# Patient Record
Sex: Male | Born: 2000 | Race: White | Hispanic: No | Marital: Single | Smoking: Never smoker
Health system: Southern US, Community
[De-identification: ages and names within clinical notes are randomized; demographics above are authoritative.]

---
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
## 2021-01-05 ENCOUNTER — Ambulatory Visit
Admission: RE | Admit: 2021-01-05 | Discharge: 2021-01-05 | Disposition: A | Discharge status: Home / Self Care | Payer: Commercial Managed Care - PPO | Source: Ambulatory Visit | Attending: Emergency Medicine | Admitting: Emergency Medicine

## 2021-01-05 ENCOUNTER — Other Ambulatory Visit: Payer: Self-pay

## 2021-01-05 VITALS — BP 119/69 | HR 76 | Temp 97.8°F | Resp 18

## 2021-01-05 DIAGNOSIS — B070 Plantar wart: Secondary | ICD-10-CM | POA: Diagnosis not present

## 2021-01-05 DIAGNOSIS — J029 Acute pharyngitis, unspecified: Secondary | ICD-10-CM | POA: Diagnosis not present

## 2021-01-05 DIAGNOSIS — Z1152 Encounter for screening for COVID-19: Secondary | ICD-10-CM | POA: Insufficient documentation

## 2021-01-05 DIAGNOSIS — T7840XA Allergy, unspecified, initial encounter: Secondary | ICD-10-CM | POA: Insufficient documentation

## 2021-01-05 LAB — POCT RAPID STREP A (OFFICE): Rapid Strep A Screen: NEGATIVE

## 2021-01-05 MED ORDER — FLUTICASONE PROPIONATE 50 MCG/ACT NA SUSP: 2.0000 | Freq: Every day | NASAL | 0 refills | Status: DC

## 2021-01-05 MED ORDER — IBUPROFEN 600 MG PO TABS: 600.0000 mg | ORAL_TABLET | Freq: Four times a day (QID) | ORAL | 0 refills | Status: DC | PRN

## 2021-01-05 NOTE — ED Provider Notes (Signed)
HPI  SUBJECTIVE:  Patient reports sore throat starting yesterday. Sx worse with decongestions, talking.  Sx better with ibuprofen 600 mg.  He reports bilateral ear "tightness".  No change in hearing, otorrhea No fever   No neck stiffness  No Cough + nasal congestion, rhinorrhea, postnasal drip No Myalgias No Headache No Rash  No loss of taste or smell No shortness of breath or difficulty breathing No nausea, vomiting No diarrhea No abdominal pain     No Recent Strep,  COVID exposure No reflux sxs + States that his allergies are bothering him   No Breathing difficulty, voice changes, sensation of throat swelling shut No Drooling No Trismus No abx in past month. All immunizations UTD.  + antipyretic in past 4-6 hrs  Patient also reports a slightly painful "bump" on his left foot starting several months ago. He has a past medical history of allergies and is not on any antihistamines.  No history of GERD, frequent strep, mono, diabetes. PMD: [facility] pediatric[facility]   History reviewed. No pertinent past medical history.  History reviewed. No pertinent surgical history.  Family History  Family history unknown: Yes    Social History   Tobacco Use  . Smoking status: Never Smoker  Substance Use Topics  . Alcohol use: No    No current facility-administered medications for this encounter.  Current Outpatient Medications:  .  fluticasone (FLONASE) 50 MCG/ACT nasal spray, Place 2 sprays into both nostrils daily., Disp: 16 g, Rfl: 0 .  ibuprofen (ADVIL) 600 MG tablet, Take 1 tablet (600 mg total) by mouth every 6 (six) hours as needed., Disp: 30 tablet, Rfl: 0  No Known Allergies   ROS  As noted in HPI.   Physical Exam  BP 119/69   Pulse 76   Temp 97.8 F (36.6 C)   Resp 18   SpO2 98%   Constitutional: Well developed, well nourished, no acute distress Eyes:  EOMI, conjunctiva normal bilaterally HENT: Normocephalic, atraumatic,mucus membranes moist.  TMs  normal bilaterally.  +nasal congestion + intensely erythematous oropharynx  - enlarged tonsils - exudates.  Positive postnasal drip, cobblestoning.  Uvula midline.  Respiratory: Normal inspiratory effort Cardiovascular: Normal rate, no murmurs, rubs, gallops GI: nondistended, nontender. No appreciable splenomegaly skin: Small plantar wart on sole of left foot.  No evidence of infection Lymph: -  Anterior cervical LN.  No posterior cervical lymphadenopathy Musculoskeletal: no deformities Neurologic: Alert & oriented x 3, no focal neuro deficits Psychiatric: Speech and behavior appropriate.   ED Course   Medications - No data to display  Orders Placed This Encounter  Procedures  . Novel Coronavirus, NAA ([facility])    Standing Status:   Standing    Number of Occurrences:   1  . Culture, group A strep    Standing Status:   Standing    Number of Occurrences:   1  . POCT rapid strep A    Standing Status:   Standing    Number of Occurrences:   1    Results for orders placed or performed during the hospital encounter of 01/05/21 (from the past 24 hour(s))  POCT rapid strep A     Status: None   Collection Time: 01/05/21  3:32 PM  Result Value Ref Range   Rapid Strep A Screen Negative Negative   No results found.  ED Clinical Impression  1. Acute pharyngitis, unspecified etiology   2. Encounter for screening for COVID-19   3. Allergy, initial encounter   4. Plantar  wart of left foot     ED Assessment/Plan  1.  Sore throat.  COVID sent.  Suspect allergies with postnasal drip causing sore throat.  Will send home with Flonase, and antihistamine/decongestant combination, Tylenol/ibuprofen, Benadryl/Maalox mixture, saline nasal irrigation.  Rapid strep negative. Obtaining throat culture to guide antibiotic treatment. Discussed this with patient. Will contact him if culture is positive, and will call in Appropriate antibiotics. Patient home with ibuprofen, Tylenol, Benadryl/Maalox  mixture.. Patient to followup with PMD when necessary.  2.  Plantars wart left foot.  Advised salicylic acid and duct tape.  Follow-up with podiatry if he gets worse to have it frozen off.  Discussed labs,  MDM, plan and followup with patient. Discussed sn/sx that should prompt return to the ED. patient agrees with plan.   Meds ordered this encounter  Medications  . fluticasone (FLONASE) 50 MCG/ACT nasal spray    Sig: Place 2 sprays into both nostrils daily.    Dispense:  16 g    Refill:  0  . ibuprofen (ADVIL) 600 MG tablet    Sig: Take 1 tablet (600 mg total) by mouth every 6 (six) hours as needed.    Dispense:  30 tablet    Refill:  0     *This clinic note was created using Scientist, clinical (histocompatibility and immunogenetics). Therefore, there may be occasional mistakes despite careful proofreading.    Domenick Gong, MD 01/06/21 1209

## 2021-01-05 NOTE — ED Triage Notes (Signed)
Pt presents with c/o sore throat and right ear pain that began yesterday

## 2021-01-05 NOTE — Discharge Instructions (Addendum)
your rapid strep was negative today, so we have sent off a throat culture.  We will contact you and call in the appropriate antibiotics if your culture comes back positive for an infection requiring antibiotic treatment.  Give [place] a working phone number.  In the meantime, take an antihistamine/decongestant combination such as Claritin-D, Allegra-D, Zyrtec-D, Flonase, saline nasal irrigation with a NeilMed sinus rinse and distilled water as often as you want for your allergies/nasal congestion/postnasal drip.  1 gram of Tylenol and 600 mg ibuprofen together 3-4 times a day as needed for pain.  Make sure you drink plenty of extra fluids.  Some people find salt water gargles and  Traditional Medicinal's "Throat Coat" tea helpful. Take 5 mL of liquid Benadryl and 5 mL of Maalox. Mix it together, and then hold it in your mouth for as long as you can and then swallow. You may do this 4 times a day.    Get a salicylic acid patch to put on the plantars wart.  May also use duct tape.  Follow-up with podiatry if it becomes bothersome.  Go to www.goodrx.com  or www.costplusdrugs.com to look up your medications. This will give you a list of where you can find your prescriptions at the most affordable prices. Or ask the pharmacist what the cash price is, or if they have any other discount programs available to help make your medication more affordable. This can be less expensive than what you would pay with insurance.

## 2021-01-06 LAB — SARS-COV-2, NAA 2 DAY TAT

## 2021-01-06 LAB — NOVEL CORONAVIRUS, NAA: SARS-CoV-2, NAA: NOT DETECTED

## 2021-01-08 LAB — CULTURE, GROUP A STREP (THRC)

## 2021-06-11 ENCOUNTER — Other Ambulatory Visit: Payer: Self-pay

## 2021-06-11 ENCOUNTER — Ambulatory Visit (INDEPENDENT_AMBULATORY_CARE_PROVIDER_SITE_OTHER): Payer: Commercial Managed Care - PPO | Admitting: Emergency Medicine

## 2021-06-11 ENCOUNTER — Encounter: Payer: Self-pay | Admitting: Emergency Medicine

## 2021-06-11 VITALS — BP 115/60 | HR 81 | Ht 69.0 in | Wt 174.0 lb

## 2021-06-11 DIAGNOSIS — Z7689 Persons encountering health services in other specified circumstances: Secondary | ICD-10-CM | POA: Diagnosis not present

## 2021-06-11 DIAGNOSIS — Z13228 Encounter for screening for other metabolic disorders: Secondary | ICD-10-CM | POA: Diagnosis not present

## 2021-06-11 DIAGNOSIS — Z0000 Encounter for general adult medical examination without abnormal findings: Secondary | ICD-10-CM

## 2021-06-11 DIAGNOSIS — Z13220 Encounter for screening for lipoid disorders: Secondary | ICD-10-CM | POA: Diagnosis not present

## 2021-06-11 DIAGNOSIS — Z130 Encounter for screening for diseases of the blood and blood-forming organs and certain disorders involving the immune mechanism: Secondary | ICD-10-CM | POA: Diagnosis not present

## 2021-06-11 DIAGNOSIS — Z1159 Encounter for screening for other viral diseases: Secondary | ICD-10-CM

## 2021-06-11 DIAGNOSIS — Z1329 Encounter for screening for other suspected endocrine disorder: Secondary | ICD-10-CM

## 2021-06-11 LAB — COMPREHENSIVE METABOLIC PANEL
ALT: 20 U/L (ref 0–53)
AST: 21 U/L (ref 0–37)
Albumin: 4.5 g/dL (ref 3.5–5.2)
Alkaline Phosphatase: 82 U/L (ref 52–171)
BUN: 16 mg/dL (ref 6–23)
CO2: 27 mEq/L (ref 19–32)
Calcium: 9.3 mg/dL (ref 8.4–10.5)
Chloride: 104 mEq/L (ref 96–112)
Creatinine, Ser: 0.98 mg/dL (ref 0.40–1.50)
GFR: 112.12 mL/min (ref >60.00)
Glucose, Bld: 92 mg/dL (ref 70–99)
Potassium: 4.6 mEq/L (ref 3.5–5.1)
Sodium: 137 mEq/L (ref 135–145)
Total Bilirubin: 0.3 mg/dL (ref 0.2–1.2)
Total Protein: 7.1 g/dL (ref 6.0–8.3)

## 2021-06-11 LAB — CBC WITH DIFFERENTIAL/PLATELET
Basophils Absolute: 0 10*3/uL (ref 0.0–0.1)
Basophils Relative: 0.4 % (ref 0.0–3.0)
Eosinophils Absolute: 0.1 10*3/uL (ref 0.0–0.7)
Eosinophils Relative: 1 % (ref 0.0–5.0)
HCT: 41.9 % (ref 36.0–49.0)
Hemoglobin: 14.1 g/dL (ref 12.0–16.0)
Lymphocytes Relative: 16.4 % — ABNORMAL LOW (ref 24.0–48.0)
Lymphs Abs: 1 10*3/uL (ref 0.7–4.0)
MCHC: 33.8 g/dL (ref 31.0–37.0)
MCV: 90.4 fl (ref 78.0–98.0)
Monocytes Absolute: 0.5 10*3/uL (ref 0.1–1.0)
Monocytes Relative: 8.6 % (ref 3.0–12.0)
Neutro Abs: 4.4 10*3/uL (ref 1.4–7.7)
Neutrophils Relative %: 73.6 % — ABNORMAL HIGH (ref 43.0–71.0)
Platelets: 249 10*3/uL (ref 150.0–575.0)
RBC: 4.63 Mil/uL (ref 3.80–5.70)
RDW: 13 % (ref 11.4–15.5)
WBC: 6 10*3/uL (ref 4.5–13.5)

## 2021-06-11 LAB — LIPID PANEL
Cholesterol: 145 mg/dL (ref 0–200)
HDL: 54.3 mg/dL (ref >39.00)
LDL Cholesterol: 82 mg/dL (ref 0–99)
NonHDL: 91.07
Total CHOL/HDL Ratio: 3
Triglycerides: 45 mg/dL (ref 0.0–149.0)
VLDL: 9 mg/dL (ref 0.0–40.0)

## 2021-06-11 LAB — HEMOGLOBIN A1C: Hgb A1c MFr Bld: 5.7 % (ref 4.6–6.5)

## 2021-06-11 NOTE — Progress Notes (Signed)
Andrew Herring 20 y.o.   Chief Complaint  Patient presents with  . New Patient (Initial Visit)    Needing PCP    HISTORY OF PRESENT ILLNESS: This is a 20 y.o. 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 Wants to establish care with me. Healthy male with a healthy lifestyle.  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  No chronic medications. Has no complaints or medical concerns today.  HPI   Prior to Admission medications   Not on File    No Known Allergies  There are no problems to display for this patient.   No past medical history on file.  No past surgical history on file.  Social History   Socioeconomic History  . Marital status: Single    Spouse name: Not on file  . Number of children: Not on file  . Years of education: Not on file  . Highest education level: Not on file  Occupational History  . Not on file  Tobacco Use  . Smoking status: Never  . Smokeless tobacco: Not on file  Substance and Sexual Activity  . Alcohol use: No  . Drug use: Not on file  . Sexual activity: Not on file  Other Topics Concern  . Not on file  Social History Narrative  . Not on file   Social Determinants of Health   Financial Resource Strain: Not on file  Food Insecurity: Not on file  Transportation Needs: Not on file  Physical Activity: Not on file  Stress: Not on file  Social Connections: Not on file  Intimate Partner Violence: Not on file    Family History  Family history unknown: Yes     Review of Systems  Constitutional: Negative.  Negative for chills and fever.  HENT: Negative.  Negative for congestion and sore throat.   Eyes: Negative.   Respiratory: Negative.  Negative for cough and shortness of breath.   Cardiovascular: Negative.  Negative for chest pain and palpitations.  Gastrointestinal: Negative.  Negative for abdominal pain, blood in stool, diarrhea, melena, nausea and vomiting.   Genitourinary: Negative.  Negative for dysuria and hematuria.  Musculoskeletal: Negative.   Skin: Negative.  Negative for rash.  Neurological: Negative.  Negative for dizziness and headaches.  All other systems reviewed and are negative.   Physical Exam Vitals reviewed.  Constitutional:      Appearance: Normal appearance.  HENT:     Head: Normocephalic.     Right Ear: Tympanic membrane, ear canal and external ear normal.     Left Ear: Tympanic membrane, ear canal and external ear normal.     Mouth/Throat:     Mouth: Mucous membranes are moist.     Pharynx: Oropharynx is clear.  Eyes:     Extraocular Movements: Extraocular movements intact.     Conjunctiva/sclera: Conjunctivae normal.     Pupils: Pupils are equal, round, and reactive to light.  Neck:     Vascular: No carotid bruit.  Cardiovascular:     Rate and Rhythm: Normal rate and regular rhythm.     Pulses: Normal pulses.     Heart sounds: Normal heart sounds.  Pulmonary:     Effort: Pulmonary effort is normal.     Breath sounds: Normal breath sounds.  Abdominal:     General: Bowel sounds are normal. There is no distension.     Palpations: Abdomen is soft. There is no mass.     Tenderness: There is no abdominal tenderness.  Musculoskeletal:        General: Normal range of motion.     Cervical back: Normal range of motion and neck supple. No tenderness.     Right lower leg: No edema.     Left lower leg: No edema.  Lymphadenopathy:     Cervical: No cervical adenopathy.  Skin:    General: Skin is warm and dry.     Capillary Refill: Capillary refill takes less than 2 seconds.  Neurological:     General: No focal deficit present.     Mental Status: He is alert and oriented to person, place, and time.  Psychiatric:        Mood and Affect: Mood normal.        Behavior: Behavior normal.     ASSESSMENT & PLAN: Andrew Herring was seen today for new patient (initial visit).  Diagnoses and all orders for this  visit:  Routine general medical examination at a health care facility  Encounter to establish care  Need for hepatitis C screening test -     Hepatitis C antibody screen  Screening for deficiency anemia -     CBC with Differential  Screening for lipoid disorders -     Lipid panel  Screening for endocrine, metabolic and immunity disorder -     Comprehensive metabolic panel -     Hemoglobin A1c  Modifiable risk factors discussed with patient. Anticipatory guidance according to age provided. The following topics were also discussed: Social Determinants of Health Smoking.  Non-smoker Diet and nutrition Benefits of exercise Cancer family history reviewed Vaccinations recommendations and review Cardiovascular risk assessment Mental health including depression and anxiety Fall and accident prevention  Patient Instructions  Health Maintenance, Male Adopting a healthy lifestyle and getting preventive care are important in promoting health and wellness. Ask your health care provider about: The right schedule for you to have regular tests and exams. Things you can do on your own to prevent diseases and keep yourself healthy. What should I know about diet, weight, and exercise? Eat a healthy diet  Eat a diet that includes plenty of vegetables, fruits, low-fat dairy products, and lean protein. Do not eat a lot of foods that are high in solid fats, added sugars, or sodium. Maintain a healthy weight Body mass index (BMI) is a measurement that can be used to identify possible weight problems. It estimates body fat based on height and weight. Your health care provider can help determine your BMI and help you achieve or maintain a healthy weight. Get regular exercise Get regular exercise. This is one of the most important things you can do for your health. Most adults should: Exercise for at least 150 minutes each week. The exercise should increase your heart rate and make you sweat  (moderate-intensity exercise). Do strengthening exercises at least twice a week. This is in addition to the moderate-intensity exercise. Spend less time sitting. Even light physical activity can be beneficial. Watch cholesterol and blood lipids Have your blood tested for lipids and cholesterol at 19 years of age, then have this test every 5 years. You may need to have your cholesterol levels checked more often if: Your lipid or cholesterol levels are high. You are older than 19 years of age. You are at high risk for heart disease. What should I know about cancer screening? Many types of cancers can be detected early and may often be prevented. Depending on your health history and family history, you may need to have cancer  screening at various ages. This may include screening for: Colorectal cancer. Prostate cancer. Skin cancer. Lung cancer. What should I know about heart disease, diabetes, and high blood pressure? Blood pressure and heart disease High blood pressure causes heart disease and increases the risk of stroke. This is more likely to develop in people who have high blood pressure readings, are of African descent, or are overweight. Talk with your health care provider about your target blood pressure readings. Have your blood pressure checked: Every 3-5 years if you are 71-35 years of age. Every year if you are 25 years old or older. If you are between the ages of 46 and 28 and are a current or former smoker, ask your health care provider if you should have a one-time screening for abdominal aortic aneurysm (AAA). Diabetes Have regular diabetes screenings. This checks your fasting blood sugar level. Have the screening done: Once every three years after age 18 if you are at a normal weight and have a low risk for diabetes. More often and at a younger age if you are overweight or have a high risk for diabetes. What should I know about preventing infection? Hepatitis B If you have  a higher risk for hepatitis B, you should be screened for this virus. Talk with your health care provider to find out if you are at risk for hepatitis B infection. Hepatitis C Blood testing is recommended for: Everyone born from [birth year] through 1964. Anyone with known risk factors for hepatitis C. Sexually transmitted infections (STIs) You should be screened each year for STIs, including gonorrhea and chlamydia, if: You are sexually active and are younger than 19 years of age. You are older than 19 years of age and your health care provider tells you that you are at risk for this type of infection. Your sexual activity has changed since you were last screened, and you are at increased risk for chlamydia or gonorrhea. Ask your health care provider if you are at risk. Ask your health care provider about whether you are at high risk for HIV. Your health care provider may recommend a prescription medicine to help prevent HIV infection. If you choose to take medicine to prevent HIV, you should first get tested for HIV. You should then be tested every 3 months for as long as you are taking the medicine. Follow these instructions at home: Lifestyle Do not use any products that contain nicotine or tobacco, such as cigarettes, e-cigarettes, and chewing tobacco. If you need help quitting, ask your health care provider. Do not use street drugs. Do not share needles. Ask your health care provider for help if you need support or information about quitting drugs. Alcohol use Do not drink alcohol if your health care provider tells you not to drink. If you drink alcohol: Limit how much you have to 0-2 drinks a day. Be aware of how much alcohol is in your drink. In the [place] one drink equals one 12 oz bottle of beer (355 mL), one 5 oz glass of wine (148 mL), or one 1 oz glass of hard liquor (44 mL). General instructions Schedule regular health, dental, and eye exams. Stay current with your vaccines. Tell your  health care provider if: You often feel depressed. You have ever been abused or do not feel safe at home. Summary Adopting a healthy lifestyle and getting preventive care are important in promoting health and wellness. Follow your health care provider's instructions about healthy diet, exercising, and getting tested or  screened for diseases. Follow your health care provider's instructions on monitoring your cholesterol and blood pressure. This information is not intended to replace advice given to you by your health care provider. Make sure you discuss any questions you have with your health care provider. Document Revised: 10/27/2020 Document Reviewed: 08/12/2018 Elsevier Patient Education  2022 [employer]     Edwina Barth, MD [facility]

## 2021-06-11 NOTE — Patient Instructions (Signed)
Health Maintenance, Male Adopting a healthy lifestyle and getting preventive care are important in promoting health and wellness. Ask your health care provider about: The right schedule for you to have regular tests and exams. Things you can do on your own to prevent diseases and keep yourself healthy. What should I know about diet, weight, and exercise? Eat a healthy diet  Eat a diet that includes plenty of vegetables, fruits, low-fat dairy products, and lean protein. Do not eat a lot of foods that are high in solid fats, added sugars, or sodium. Maintain a healthy weight Body mass index (BMI) is a measurement that can be used to identify possible weight problems. It estimates body fat based on height and weight. Your health care provider can help determine your BMI and help you achieve or maintain a healthy weight. Get regular exercise Get regular exercise. This is one of the most important things you can do for your health. Most adults should: Exercise for at least 150 minutes each week. The exercise should increase your heart rate and make you sweat (moderate-intensity exercise). Do strengthening exercises at least twice a week. This is in addition to the moderate-intensity exercise. Spend less time sitting. Even light physical activity can be beneficial. Watch cholesterol and blood lipids Have your blood tested for lipids and cholesterol at 20 years of age, then have this test every 5 years. You may need to have your cholesterol levels checked more often if: Your lipid or cholesterol levels are high. You are older than 19 years of age. You are at high risk for heart disease. What should I know about cancer screening? Many types of cancers can be detected early and may often be prevented. Depending on your health history and family history, you may need to have cancer screening at various ages. This may include screening for: Colorectal cancer. Prostate cancer. Skin cancer. Lung  cancer. What should I know about heart disease, diabetes, and high blood pressure? Blood pressure and heart disease High blood pressure causes heart disease and increases the risk of stroke. This is more likely to develop in people who have high blood pressure readings, are of African descent, or are overweight. Talk with your health care provider about your target blood pressure readings. Have your blood pressure checked: Every 3-5 years if you are 18-39 years of age. Every year if you are 40 years old or older. If you are between the ages of 65 and 75 and are a current or former smoker, ask your health care provider if you should have a one-time screening for abdominal aortic aneurysm (AAA). Diabetes Have regular diabetes screenings. This checks your fasting blood sugar level. Have the screening done: Once every three years after age 45 if you are at a normal weight and have a low risk for diabetes. More often and at a younger age if you are overweight or have a high risk for diabetes. What should I know about preventing infection? Hepatitis B If you have a higher risk for hepatitis B, you should be screened for this virus. Talk with your health care provider to find out if you are at risk for hepatitis B infection. Hepatitis C Blood testing is recommended for: Everyone born from 1945 through 1965. Anyone with known risk factors for hepatitis C. Sexually transmitted infections (STIs) You should be screened each year for STIs, including gonorrhea and chlamydia, if: You are sexually active and are younger than 19 years of age. You are older than 19 years   of age and your health care provider tells you that you are at risk for this type of infection. Your sexual activity has changed since you were last screened, and you are at increased risk for chlamydia or gonorrhea. Ask your health care provider if you are at risk. Ask your health care provider about whether you are at high risk for HIV.  Your health care provider may recommend a prescription medicine to help prevent HIV infection. If you choose to take medicine to prevent HIV, you should first get tested for HIV. You should then be tested every 3 months for as long as you are taking the medicine. Follow these instructions at home: Lifestyle Do not use any products that contain nicotine or tobacco, such as cigarettes, e-cigarettes, and chewing tobacco. If you need help quitting, ask your health care provider. Do not use street drugs. Do not share needles. Ask your health care provider for help if you need support or information about quitting drugs. Alcohol use Do not drink alcohol if your health care provider tells you not to drink. If you drink alcohol: Limit how much you have to 0-2 drinks a day. Be aware of how much alcohol is in your drink. In the [place] one drink equals one 12 oz bottle of beer (355 mL), one 5 oz glass of wine (148 mL), or one 1 oz glass of hard liquor (44 mL). General instructions Schedule regular health, dental, and eye exams. Stay current with your vaccines. Tell your health care provider if: You often feel depressed. You have ever been abused or do not feel safe at home. Summary Adopting a healthy lifestyle and getting preventive care are important in promoting health and wellness. Follow your health care provider's instructions about healthy diet, exercising, and getting tested or screened for diseases. Follow your health care provider's instructions on monitoring your cholesterol and blood pressure. This information is not intended to replace advice given to you by your health care provider. Make sure you discuss any questions you have with your health care provider. Document Revised: 10/27/2020 Document Reviewed: 08/12/2018 Elsevier Patient Education  2022 [employer] Inc.  

## 2021-06-12 LAB — HEPATITIS C ANTIBODY
Hepatitis C Ab: NONREACTIVE
SIGNAL TO CUT-OFF: 0.03 (ref <1.00)

## 2021-12-20 ENCOUNTER — Ambulatory Visit: Payer: Commercial Managed Care - PPO | Admitting: Emergency Medicine

## 2022-01-17 ENCOUNTER — Encounter: Payer: Self-pay | Admitting: Internal Medicine

## 2022-01-17 NOTE — Progress Notes (Signed)
Subjective:    Patient ID: Andrew Herring, male    DOB: Feb 27, 2001, 21 y.o.   MRN: [id number]      HPI Kaii is here for  Chief Complaint  Patient presents with   Abdominal Pain   Nausea    Stomach issues, pain and nausea -he thinks his symptoms started about 4 months ago.  Initially he only had lower abdominal pain when he was working out and exerting himself, but now his pain is almost daily.  He also started to have decreased appetite.  He gets nauseous when hungry and gets full quikcly.  No lower abdominal pain worsens after he is eating and when he is eating.  He thinks the loss of appetite and not being able to eat as much has resulted in about 10 pound weight loss.     He does state significant fatigue.  He has had some issues sleeping which has not improved since being home from college.  He does feel lightheaded at times.  At times his abdomen feels bloated.  He denies any blood in the stool or black stool.  He denies any urinary symptoms.  He has not vomited.  Symptoms are not related to any specific food or times a day.    Medications and allergies reviewed with patient and updated if appropriate.  No current outpatient medications on file prior to visit.   No current facility-administered medications on file prior to visit.    Review of Systems  Constitutional:  Positive for appetite change (decreased - lost weight), fatigue and unexpected weight change (10 lbs - not hungry). Negative for chills and fever.  Respiratory:  Positive for cough (allergies). Negative for shortness of breath and wheezing.   Cardiovascular:  Positive for palpitations (occ). Negative for chest pain.  Gastrointestinal:  Positive for abdominal distention (sometimes), abdominal pain, diarrhea (more frequent last 1-2 weeks - has normal stools) and nausea. Negative for blood in stool (no melena), constipation and vomiting.       No gerd.   burping more x 1-2 weeks  Genitourinary:  Negative for  difficulty urinating, dysuria and hematuria.  Neurological:  Positive for light-headedness (occ - ? from missing meals). Negative for headaches.      Objective:   Vitals:   01/18/22 1021  BP: 116/82  Pulse: 86  Temp: 98.6 F (37 C)  SpO2: 98%   BP Readings from Last 3 Encounters:  01/18/22 116/82  06/11/21 115/60  01/05/21 119/69   Wt Readings from Last 3 Encounters:  01/18/22 175 lb (79.4 kg) (77 %, Z= 0.72)*  06/11/21 174 lb (78.9 kg) (78 %, Z= 0.76)*  09/02/15 107 lb (48.5 kg) (55 %, Z= 0.14)*   * Growth percentiles are based on CDC (Boys, 2-20 Years) data.   Body mass index is 25.8 kg/m.    Physical Exam Constitutional:      Appearance: Normal appearance.  HENT:     Head: Normocephalic.  Cardiovascular:     Rate and Rhythm: Normal rate and regular rhythm.     Heart sounds: No murmur heard. Pulmonary:     Effort: Pulmonary effort is normal. No respiratory distress.     Breath sounds: Normal breath sounds. No wheezing or rales.  Abdominal:     General: There is no distension.     Palpations: Abdomen is soft. There is no mass.     Tenderness: There is abdominal tenderness (Positive tenderness across lower abdomen with palpation). There is no guarding or  rebound.     Hernia: No hernia is present.  Musculoskeletal:     Cervical back: No tenderness.     Right lower leg: No edema.     Left lower leg: No edema.  Lymphadenopathy:     Cervical: No cervical adenopathy.  Skin:    General: Skin is warm and dry.  Neurological:     Mental Status: He is alert.        Blood work from last fall unremarkable.   Assessment & Plan:    Lower abdomen pain, nausea, decreased appetite, diarrhea, unintended weight loss: Acute He thinks his symptoms started about 4 months ago, but they have progressively worsened and now he is experiencing them daily He has pain across his lower abdomen He states nausea without any vomiting, decreased appetite associated with about 10  pound weight loss The past couple of weeks he has also had increased diarrhea, but the diarrhea is not consistent-he does have some normal bowel movements No blood in the stool or black stool CBC, CMP Will get CT abdomen and pelvis since he has some tenderness across the lower abdomen Depending on above results may need to see GI

## 2022-01-18 ENCOUNTER — Telehealth: Payer: Self-pay

## 2022-01-18 ENCOUNTER — Ambulatory Visit (INDEPENDENT_AMBULATORY_CARE_PROVIDER_SITE_OTHER): Payer: Commercial Managed Care - PPO | Admitting: Internal Medicine

## 2022-01-18 VITALS — BP 116/82 | HR 86 | Temp 98.6°F | Ht 69.06 in | Wt 175.0 lb

## 2022-01-18 DIAGNOSIS — R197 Diarrhea, unspecified: Secondary | ICD-10-CM | POA: Diagnosis not present

## 2022-01-18 DIAGNOSIS — R630 Anorexia: Secondary | ICD-10-CM

## 2022-01-18 DIAGNOSIS — R1030 Lower abdominal pain, unspecified: Secondary | ICD-10-CM

## 2022-01-18 DIAGNOSIS — R634 Abnormal weight loss: Secondary | ICD-10-CM | POA: Diagnosis not present

## 2022-01-18 DIAGNOSIS — R110 Nausea: Secondary | ICD-10-CM

## 2022-01-18 LAB — CBC WITH DIFFERENTIAL/PLATELET
Basophils Absolute: 0 10*3/uL (ref 0.0–0.1)
Basophils Relative: 0.9 % (ref 0.0–3.0)
Eosinophils Absolute: 0.1 10*3/uL (ref 0.0–0.7)
Eosinophils Relative: 3 % (ref 0.0–5.0)
HCT: 40.2 % (ref 36.0–49.0)
Hemoglobin: 13.8 g/dL (ref 12.0–16.0)
Lymphocytes Relative: 29 % (ref 24.0–48.0)
Lymphs Abs: 1.1 10*3/uL (ref 0.7–4.0)
MCHC: 34.2 g/dL (ref 31.0–37.0)
MCV: 89.8 fl (ref 78.0–98.0)
Monocytes Absolute: 0.4 10*3/uL (ref 0.1–1.0)
Monocytes Relative: 11.3 % (ref 3.0–12.0)
Neutro Abs: 2 10*3/uL (ref 1.4–7.7)
Neutrophils Relative %: 55.8 % (ref 43.0–71.0)
Platelets: 259 10*3/uL (ref 150.0–575.0)
RBC: 4.48 Mil/uL (ref 3.80–5.70)
RDW: 13.4 % (ref 11.4–15.5)
WBC: 3.6 10*3/uL — ABNORMAL LOW (ref 4.5–13.5)

## 2022-01-18 LAB — COMPREHENSIVE METABOLIC PANEL
ALT: 21 U/L (ref 0–53)
AST: 25 U/L (ref 0–37)
Albumin: 4.8 g/dL (ref 3.5–5.2)
Alkaline Phosphatase: 59 U/L (ref 52–171)
BUN: 17 mg/dL (ref 6–23)
CO2: 28 mEq/L (ref 19–32)
Calcium: 9.6 mg/dL (ref 8.4–10.5)
Chloride: 103 mEq/L (ref 96–112)
Creatinine, Ser: 0.93 mg/dL (ref 0.40–1.50)
GFR: 118.89 mL/min (ref >60.00)
Glucose, Bld: 93 mg/dL (ref 70–99)
Potassium: 4.1 mEq/L (ref 3.5–5.1)
Sodium: 138 mEq/L (ref 135–145)
Total Bilirubin: 0.5 mg/dL (ref 0.2–1.2)
Total Protein: 7.7 g/dL (ref 6.0–8.3)

## 2022-01-18 MED ORDER — TRAZODONE HCL 50 MG PO TABS: 50.0000 mg | ORAL_TABLET | Freq: Every day | ORAL | 0 refills | Status: DC

## 2022-01-18 NOTE — Telephone Encounter (Signed)
I would recommend starting with something a little bit more mild such as trazodone.  I can send this in if he wants to try it.  I would recommend since this is a new medication he follows up with his PCP in 2-3 weeks   Let me know if they want to try the medication.

## 2022-01-18 NOTE — Telephone Encounter (Signed)
Pt mother is calling in asking if Ambien or something in that class could be prescribed until the stomach issue is figured out.  Please advise

## 2022-01-18 NOTE — Patient Instructions (Addendum)
     Blood work was ordered.     Medications changes include :   none   A Ct scan of your abdomen was ordered.     Someone will call you to schedule an appointment.

## 2022-03-26 ENCOUNTER — Ambulatory Visit (HOSPITAL_BASED_OUTPATIENT_CLINIC_OR_DEPARTMENT_OTHER): Admission: RE | Admit: 2022-03-26 | Payer: Commercial Managed Care - PPO | Source: Ambulatory Visit

## 2022-11-12 ENCOUNTER — Other Ambulatory Visit: Payer: Self-pay

## 2022-11-12 ENCOUNTER — Ambulatory Visit
Admission: EM | Admit: 2022-11-12 | Discharge: 2022-11-12 | Disposition: A | Discharge status: Home / Self Care | Payer: Commercial Managed Care - PPO

## 2022-11-12 ENCOUNTER — Encounter: Payer: Self-pay | Admitting: Emergency Medicine

## 2022-11-12 DIAGNOSIS — R21 Rash and other nonspecific skin eruption: Secondary | ICD-10-CM | POA: Diagnosis not present

## 2022-11-12 LAB — POCT MONO SCREEN (KUC): Mono, POC: NEGATIVE

## 2022-11-12 LAB — POCT RAPID STREP A (OFFICE): Rapid Strep A Screen: NEGATIVE

## 2022-11-12 NOTE — Discharge Instructions (Signed)
Your strep and mono test are negative Your rash looks like a viral rash, and could be it is still mono, it has not turned the test positive yet.  Follow up with your primary care doctor this week for further test to be done

## 2022-11-12 NOTE — ED Provider Notes (Signed)
[facility]    CSN: [id number] Arrival date & time: 11/12/22  1937      History   Chief Complaint Chief Complaint  Patient presents with   Rash    Entered by patient    HPI Andrew Herring is a 22 y.o. male who develop HA, fever up to 102.2, aches but no cough, ST or RN. Then today noticed rash about 2 h ago, and his mother wanted [place] to r/o scarlatina. Has been fatigued. The rash is not itchy or painful and only located on thorax. Has not eaten anything different, and only took Ibuprofen while sick for the fever. Has not been around any puppies. Denies hx of tick bite.    History reviewed. No pertinent past medical history.  There are no problems to display for this patient.   History reviewed. No pertinent surgical history.   Home Medications    Prior to Admission medications   Medication Sig Start Date End Date Taking? Authorizing Provider  omeprazole (PRILOSEC) 40 MG capsule Take 40 mg by mouth daily.   Yes [provider]    Family History Family History  Family history unknown: Yes    Social History Social History   Tobacco Use   Smoking status: Never   Smokeless tobacco: Never  Substance Use Topics   Alcohol use: No     Allergies   Patient has no known allergies.   Review of Systems Review of Systems  Constitutional:  Positive for fatigue and fever. Negative for activity change and appetite change.  HENT:  Negative for congestion, ear discharge, ear pain, postnasal drip, rhinorrhea and sore throat.   Eyes:  Negative for discharge.  Respiratory:  Negative for cough and chest tightness.   Gastrointestinal:        No new abdominal pain other that his chronic all over pain off and on  Musculoskeletal:  Negative for myalgias.  Skin:  Positive for rash. Negative for wound.  Neurological:  Positive for headaches.  Hematological:  Negative for adenopathy.     Physical Exam Triage Vital Signs ED Triage Vitals [11/12/22 1942]   Enc Vitals Group     BP 130/73     Pulse Rate (!) 102     Resp 20     Temp 98.6 F (37 C)     Temp Source Oral     SpO2 97 %     Weight      Height      Head Circumference      Peak Flow      Pain Score      Pain Loc      Pain Edu?      Excl. in [place]?    No data found.  Updated Vital Signs BP 130/73 (BP Location: Right Arm)   Pulse (!) 102   Temp 98.6 F (37 C) (Oral)   Resp 20   SpO2 97%   Visual Acuity Right Eye Distance:   Left Eye Distance:   Bilateral Distance:    Right Eye Near:   Left Eye Near:    Bilateral Near:     Physical Exam Vitals and nursing note reviewed.  Constitutional:      General: He is not in acute distress.    Appearance: He is normal weight. He is not toxic-appearing.  HENT:     Right Ear: Tympanic membrane, ear canal and external ear normal.     Left Ear: Tympanic membrane, ear canal and external ear  normal.     Nose: Nose normal.     Mouth/Throat:     Mouth: Mucous membranes are moist.     Pharynx: Oropharynx is clear.  Eyes:     General: No scleral icterus.    Conjunctiva/sclera: Conjunctivae normal.  Pulmonary:     Effort: Pulmonary effort is normal.  Abdominal:     General: Bowel sounds are normal.     Palpations: Abdomen is soft. There is no mass.     Tenderness: There is no abdominal tenderness. There is no guarding or rebound.  Musculoskeletal:        General: No swelling. Normal range of motion.     Cervical back: Neck supple. No tenderness.  Lymphadenopathy:     Cervical: No cervical adenopathy.  Skin:    General: Skin is warm and dry.     Findings: Rash present.     Comments: Has a flat macular rash on thorax.   Neurological:     Mental Status: He is alert and oriented to person, place, and time.     Gait: Gait normal.  Psychiatric:        Mood and Affect: Mood normal.        Behavior: Behavior normal.        Thought Content: Thought content normal.        Judgment: Judgment normal.      UC Treatments /  Results  Labs (all labs ordered are listed, but only abnormal results are displayed) Labs Reviewed  POCT RAPID STREP A (OFFICE)  POCT MONO SCREEN ([facility])    EKG   Radiology No results found.  Procedures Procedures (including critical care time)  Medications Ordered in UC Medications - No data to display  Initial Impression / Assessment and Plan / UC Course  I have reviewed the triage vital signs and the nursing notes.  Pertinent labs  results that were available during my care of the patient were reviewed by me and considered in my medical decision making (see chart for details).  Viral exanthem  Pt declined to have a covid test. He was advised to FU with PCP this week, if the rash did not resolve or got worse for further work up.    Final Clinical Impressions(s) / UC Diagnoses   Final diagnoses:  Rash and nonspecific skin eruption     Discharge Instructions      Your strep and mono test are negative Your rash looks like a viral rash, and could be it is still mono, it has not turned the test positive yet.  Follow up with your primary care doctor this week for further test to be done      ED Prescriptions   None    PDMP not reviewed this encounter.   Shelby Mattocks, Hershal Coria 11/12/22 2044

## 2022-11-12 NOTE — ED Triage Notes (Signed)
Pt reports rash to abdomen,chest, and posterior back since Friday. Pt reports intermittent fever and denies any other symptoms or new self care products.

## 2023-03-17 ENCOUNTER — Telehealth: Payer: Self-pay

## 2023-03-17 NOTE — Telephone Encounter (Signed)
LVM for patient to call back [phone], or to call PCP office to schedule follow up apt. AS, CMA
